# Patient Record
Sex: Female | Born: 1935 | Race: White | Hispanic: No | Marital: Single | Smoking: Never smoker
Health system: Southern US, Community
[De-identification: ages and names within clinical notes are randomized; demographics above are authoritative.]

## PROBLEM LIST (undated history)

## (undated) DIAGNOSIS — K219 Gastro-esophageal reflux disease without esophagitis: Secondary | ICD-10-CM

## (undated) DIAGNOSIS — E7800 Pure hypercholesterolemia, unspecified: Secondary | ICD-10-CM

## (undated) DIAGNOSIS — I4891 Unspecified atrial fibrillation: Secondary | ICD-10-CM

## (undated) HISTORY — PX: CORONARY ANGIOPLASTY WITH STENT PLACEMENT: SHX49

## (undated) HISTORY — DX: Gastro-esophageal reflux disease without esophagitis: K21.9

---

## 2015-12-18 ENCOUNTER — Emergency Department (HOSPITAL_BASED_OUTPATIENT_CLINIC_OR_DEPARTMENT_OTHER)
Admission: EM | Admit: 2015-12-18 | Discharge: 2015-12-18 | Disposition: A | Discharge status: Home / Self Care | Payer: Medicare Other | Attending: Emergency Medicine | Admitting: Emergency Medicine

## 2015-12-18 ENCOUNTER — Encounter (HOSPITAL_BASED_OUTPATIENT_CLINIC_OR_DEPARTMENT_OTHER): Payer: Self-pay | Admitting: Emergency Medicine

## 2015-12-18 ENCOUNTER — Emergency Department (HOSPITAL_BASED_OUTPATIENT_CLINIC_OR_DEPARTMENT_OTHER): Payer: Medicare Other

## 2015-12-18 DIAGNOSIS — Z7982 Long term (current) use of aspirin: Secondary | ICD-10-CM | POA: Insufficient documentation

## 2015-12-18 DIAGNOSIS — M1711 Unilateral primary osteoarthritis, right knee: Secondary | ICD-10-CM | POA: Insufficient documentation

## 2015-12-18 DIAGNOSIS — Z79899 Other long term (current) drug therapy: Secondary | ICD-10-CM | POA: Insufficient documentation

## 2015-12-18 DIAGNOSIS — M25562 Pain in left knee: Secondary | ICD-10-CM

## 2015-12-18 HISTORY — DX: Unspecified atrial fibrillation: I48.91

## 2015-12-18 HISTORY — DX: Pure hypercholesterolemia, unspecified: E78.00

## 2015-12-18 MED ORDER — HYDROCODONE-ACETAMINOPHEN 5-325 MG PO TABS: 1.0000 | ORAL_TABLET | Freq: Four times a day (QID) | ORAL | 0 refills | Status: AC | PRN

## 2015-12-18 MED ORDER — BUPIVACAINE HCL 0.5 % IJ SOLN
10.0000 mL | Freq: Once | INTRAMUSCULAR | Status: AC
  Administered 2015-12-18: 10 mL
  Filled 2015-12-18: qty 1

## 2015-12-18 MED ORDER — HYDROCODONE-ACETAMINOPHEN 5-325 MG PO TABS
1.0000 | ORAL_TABLET | Freq: Once | ORAL | Status: AC
  Administered 2015-12-18: 1 via ORAL
  Filled 2015-12-18: qty 1

## 2015-12-18 MED ORDER — TRIAMCINOLONE ACETONIDE 40 MG/ML IJ SUSP
40.0000 mg | Freq: Once | INTRAMUSCULAR | Status: AC
  Administered 2015-12-18: 40 mg via INTRA_ARTICULAR
  Filled 2015-12-18: qty 5

## 2015-12-18 NOTE — ED Notes (Signed)
Pt made aware to return if symptoms worsen or if any life threatening symptoms occur.   

## 2015-12-18 NOTE — ED Provider Notes (Signed)
[facility]   CSN: [id number] Arrival date & time: 12/18/15  1036     History   Chief Complaint Chief Complaint  Patient presents with  . Knee Pain    left    HPI Colleen Berger is a 82 y.o. female.  The history is provided by the patient.  Knee Pain   This is a recurrent problem. Episode onset: 1 week but much worse today. The problem occurs constantly. The problem has been gradually worsening. The pain is present in the left knee. The quality of the pain is described as aching, pounding, sharp and constant. The pain is at a severity of 9/10. The pain is severe. Associated symptoms include limited range of motion and stiffness. The symptoms are aggravated by activity and standing. She has tried OTC pain medications for the symptoms. The treatment provided no relief. There has been no history of extremity trauma.    Past Medical History:  Diagnosis Date  . A-fib (HCC)   . Hypercholesteremia     There are no active problems to display for this patient.   Past Surgical History:  Procedure Laterality Date  . CORONARY ANGIOPLASTY WITH STENT PLACEMENT      OB History    No data available       Home Medications    Prior to Admission medications   Medication Sig Start Date End Date Taking? Authorizing Provider  aspirin 81 MG chewable tablet Chew by mouth daily.   Yes Historical Provider, MD  diltiazem (CARDIZEM) 120 MG tablet Take 120 mg by mouth 4 (four) times daily.   Yes Historical Provider, MD  lisinopril (PRINIVIL,ZESTRIL) 10 MG tablet Take 10 mg by mouth daily.   Yes Historical Provider, MD  magnesium oxide (MAG-OX) 400 MG tablet Take 400 mg by mouth daily.   Yes Historical Provider, MD  simvastatin (ZOCOR) 80 MG tablet Take 80 mg by mouth daily.   Yes Historical Provider, MD  HYDROcodone-acetaminophen (NORCO/VICODIN) 5-325 MG tablet Take 1 tablet by mouth every 6 (six) hours as needed for severe pain. 12/18/15   Gwyneth Sprout, MD     Family History History reviewed. No pertinent family history.  Social History Social History  Substance Use Topics  . Smoking status: Never Smoker  . Smokeless tobacco: Never Used  . Alcohol use No     Allergies   Review of patient's allergies indicates no known allergies.   Review of Systems Review of Systems  Constitutional: Negative for fever.  Musculoskeletal: Positive for stiffness.  All other systems reviewed and are negative.    Physical Exam Updated Vital Signs BP 142/90 (BP Location: Right Arm)   Pulse 84   Temp 98.2 F (36.8 C) (Oral)   Resp 18   Ht 5\' 6"  (1.676 m)   Wt 195 lb (88.5 kg)   SpO2 100%   BMI 31.47 kg/m   Physical Exam  Constitutional: She is oriented to person, place, and time. She appears well-developed and well-nourished. No distress.  HENT:  Head: Normocephalic and atraumatic.  Eyes: EOM are normal. Pupils are equal, round, and reactive to light.  Cardiovascular: Normal rate.   Pulmonary/Chest: Effort normal.  Musculoskeletal:       Left knee: She exhibits decreased range of motion, swelling and bony tenderness. She exhibits no ecchymosis, no deformity and no erythema. Tenderness found. Medial joint line tenderness noted.  Neurological: She is alert and oriented to person, place, and time.  Skin: Skin is warm and dry. Capillary  refill takes less than 2 seconds.  Psychiatric: She has a normal mood and affect. Her behavior is normal.  Nursing note and vitals reviewed.    ED Treatments / Results  Labs (all labs ordered are listed, but only abnormal results are displayed) Labs Reviewed - No data to display  EKG  EKG Interpretation None       Radiology Dg Knee Complete 4 Views Left  Result Date: 12/18/2015 CLINICAL DATA:  82 year old female with knee pain EXAM: LEFT KNEE - COMPLETE 4+ VIEW COMPARISON:  None. FINDINGS: No joint effusion. There is no fracture or subluxation identified. Mild tricompartment osteoarthritis is  identified. IMPRESSION: 1. No acute findings. 2. Osteoarthritis. Electronically Signed   By: Signa Kellaylor  Stroud M.D.   On: 12/18/2015 11:30    Procedures .Joint Aspiration/Arthrocentesis Date/Time: 12/18/2015 12:58 PM Performed by: Gwyneth SproutPLUNKETT, Brittnee Gaetano Authorized by: Gwyneth SproutPLUNKETT, Dion Sibal   Consent:    Consent obtained:  Verbal   Consent given by:  Patient   Risks discussed:  Bleeding, infection and pain   Alternatives discussed:  Referral and observation Location:    Location:  Knee   Knee:  L knee Anesthesia (see MAR for exact dosages):    Anesthesia method:  Local infiltration   Local anesthetic:  Bupivacaine 0.5% w/o epi Procedure details:    Preparation: Patient was prepped and draped in usual sterile fashion     Needle gauge: 21.   Ultrasound guidance: no     Approach:  Medial   Aspirate amount:  Flash of bloody fluid   Aspirate characteristics:  Blood-tinged   Steroid injected: yes     Specimen collected: no   Post-procedure details:    Dressing:  Adhesive bandage   Patient tolerance of procedure:  Tolerated well, no immediate complications   (including critical care time)  Medications Ordered in ED Medications  bupivacaine (MARCAINE) 0.5 % (with pres) injection 10 mL (10 mLs Infiltration Given by Other 12/18/15 1124)  triamcinolone acetonide (KENALOG-40) injection 40 mg (40 mg Intra-articular Given by Other 12/18/15 1123)  HYDROcodone-acetaminophen (NORCO/VICODIN) 5-325 MG per tablet 1 tablet (1 tablet Oral Given 12/18/15 1216)     Initial Impression / Assessment and Plan / ED Course  I have reviewed the triage vital signs and the nursing notes.  Pertinent labs & imaging results that were available during my care of the patient were reviewed by me and considered in my medical decision making (see chart for details).  Clinical Course   Patient with known arthritis of her bilateral knees presenting with 1 week of worsening pain in her left knee today. Patient has had  injections in the past but is requesting injection in her knee today for pain. 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 No evidence of septic joint or gout. There is no erythema but mild swelling. X-rays consistent with osteoarthritis. Patient received an intra-articular injection of Kenalog and bupivacaine. She had significant improvement in her pain and she was given hydrocodone to use when necessary. Also instructed to follow-up with Dr. Kristen LoaderFurr  Final Clinical Impressions(s) / ED Diagnoses   Final diagnoses:  Knee pain, acute, left  Primary osteoarthritis of right knee    New Prescriptions Discharge Medication List as of 12/18/2015 12:08 PM    START taking these medications   Details  HYDROcodone-acetaminophen (NORCO/VICODIN) 5-325 MG tablet Take 1 tablet by mouth every 6 (six) hours as needed for severe pain., Starting Sun 12/18/2015, Print  Gwyneth Sprout, MD 12/18/15 1300

## 2015-12-18 NOTE — ED Triage Notes (Signed)
Patient states that she has had an ache to her left knee x 1 week

## 2016-08-11 ENCOUNTER — Emergency Department (HOSPITAL_BASED_OUTPATIENT_CLINIC_OR_DEPARTMENT_OTHER)
Admission: EM | Admit: 2016-08-11 | Discharge: 2016-08-12 | Disposition: A | Discharge status: Home / Self Care | Payer: Medicare Other | Attending: Emergency Medicine | Admitting: Emergency Medicine

## 2016-08-11 ENCOUNTER — Encounter (HOSPITAL_BASED_OUTPATIENT_CLINIC_OR_DEPARTMENT_OTHER): Payer: Self-pay | Admitting: Emergency Medicine

## 2016-08-11 DIAGNOSIS — Z7982 Long term (current) use of aspirin: Secondary | ICD-10-CM | POA: Insufficient documentation

## 2016-08-11 DIAGNOSIS — L509 Urticaria, unspecified: Secondary | ICD-10-CM

## 2016-08-11 DIAGNOSIS — Z79899 Other long term (current) drug therapy: Secondary | ICD-10-CM | POA: Insufficient documentation

## 2016-08-11 DIAGNOSIS — R21 Rash and other nonspecific skin eruption: Secondary | ICD-10-CM | POA: Diagnosis present

## 2016-08-11 MED ORDER — DEXAMETHASONE SODIUM PHOSPHATE 10 MG/ML IJ SOLN
10.0000 mg | Freq: Once | INTRAMUSCULAR | Status: AC
  Administered 2016-08-12: 10 mg via INTRAMUSCULAR
  Filled 2016-08-11: qty 1

## 2016-08-11 NOTE — ED Triage Notes (Addendum)
PT presents to ED with complaints of redness and itching to palms and forearms. Pt took 4 benadryl 25 mg  Today. Pt has history of sam and states she normally gets a prednisone shot when this happens.

## 2016-08-11 NOTE — ED Notes (Signed)
ED Provider at bedside. 

## 2016-08-11 NOTE — ED Provider Notes (Signed)
[facility]   CSN: [id number] Arrival date & time: 08/11/16  2032  By signing my name below, I, Doreatha Martin and Deland Pretty, attest that this documentation has been prepared under the direction and in the presence of Fayrene Helper, PA-C. Electronically Signed: Doreatha Martin and Deland Pretty, ED Scribe. 08/12/16. 12:05 AM.   History   Chief Complaint Chief Complaint  Patient presents with  . Itching    HPI Colleen Berger is a 83 y.o. female who presents to the Emergency Department complaining of a gradually spreading pruritic rash to the palms, bilateral forearms and the belt line that began at noon. Per pt, her rash started in palms of her hands and began spreading to the forearms and belt line of the abdomen. Pt states she has taken 100 mg Benadryl with no relief of her symptoms. No new soaps, lotions, detergents, foods, animals, plants, medications. Pt reports h/o similar rashes and itching, but has not been able to ascertain the cause. She does reports they are becoming more frequent. Per pt, her current symptoms are slightly more severe than normal. She has never been evaluated for this issue by a dermatologist, hematologist or allergist. She denies rash in the mouth, tongue or lip swelling, difficulty breathing, CP, SOB, abdominal pain, diarrhea, nausea, vomiting.   The history is provided by the patient and a relative. No language interpreter was used.    Past Medical History:  Diagnosis Date  . A-fib (HCC)   . Hypercholesteremia     There are no active problems to display for this patient.   Past Surgical History:  Procedure Laterality Date  . CORONARY ANGIOPLASTY WITH STENT PLACEMENT      OB History    No data available       Home Medications    Prior to Admission medications   Medication Sig Start Date End Date Taking? Authorizing Provider  aspirin 81 MG chewable tablet Chew by mouth daily.    Historical Provider, MD  diltiazem  (CARDIZEM) 120 MG tablet Take 120 mg by mouth 4 (four) times daily.    Historical Provider, MD  HYDROcodone-acetaminophen (NORCO/VICODIN) 5-325 MG tablet Take 1 tablet by mouth every 6 (six) hours as needed for severe pain. 12/18/15   Gwyneth Sprout, MD  lisinopril (PRINIVIL,ZESTRIL) 10 MG tablet Take 10 mg by mouth daily.    Historical Provider, MD  magnesium oxide (MAG-OX) 400 MG tablet Take 400 mg by mouth daily.    Historical Provider, MD  simvastatin (ZOCOR) 80 MG tablet Take 80 mg by mouth daily.    Historical Provider, MD    Family History No family history on file.  Social History Social History  Substance Use Topics  . Smoking status: Never Smoker  . Smokeless tobacco: Never Used  . Alcohol use No     Allergies   Patient has no known allergies.   Review of Systems Review of Systems  HENT: Negative for facial swelling and trouble swallowing.   Respiratory: Negative for shortness of breath.   Cardiovascular: Negative for chest pain.  Gastrointestinal: Negative for abdominal pain, diarrhea, nausea and vomiting.  Skin: Positive for rash.     Physical Exam Updated Vital Signs BP 125/86 (BP Location: Left Arm)   Pulse 92   Temp 98 F (36.7 C) (Oral)   Resp 19   Ht 5' 5.5" (1.664 m)   Wt 200 lb (90.7 kg)   SpO2 100%   BMI 32.78 kg/m   Physical Exam  Constitutional: She appears well-developed and well-nourished.  HENT:  Head: Normocephalic.  Mouth/Throat: Uvula is midline, oropharynx is clear and moist and mucous membranes are normal. No oropharyngeal exudate, posterior oropharyngeal edema or posterior oropharyngeal erythema.  No tongue swelling, airway patent. Throat normal.   Eyes: Conjunctivae are normal.  Cardiovascular: Normal rate, regular rhythm and normal heart sounds.  Exam reveals no gallop and no friction rub.   No murmur heard. Pulmonary/Chest: Effort normal and breath sounds normal. No respiratory distress. She has no wheezes.  Abdominal: She  exhibits no distension.  Musculoskeletal: Normal range of motion.  Neurological: She is alert.  Skin: Skin is warm and dry. Rash noted.  Fine maculopapular rash noted throughout bilateral forearms and belt line of abdomen.   Psychiatric: She has a normal mood and affect. Her behavior is normal.  Nursing note and vitals reviewed.    ED Treatments / Results   DIAGNOSTIC STUDIES: Oxygen Saturation is 100% on RA, normal by my interpretation.   COORDINATION OF CARE: 11:39 PM-Discussed next steps with pt which includes IM decadron. Pt verbalized understanding and is agreeable with the plan.   Labs (all labs ordered are listed, but only abnormal results are displayed) Labs Reviewed - No data to display  EKG  EKG Interpretation None       Radiology No results found.  Procedures Procedures (including critical care time)  Medications Ordered in ED Medications  dexamethasone (DECADRON) injection 10 mg (not administered)     Initial Impression / Assessment and Plan / ED Course  I have reviewed the triage vital signs and the nursing notes.  Pertinent labs & imaging results that were available during my care of the patient were reviewed by me and considered in my medical decision making (see chart for details).   Allyn Kenner presents to the ED for evaluation of itchy rash, suspect allergic rash to unknown source.  Hx of same multiple times in the past.  No evidence concerning for anaphylaxis. Patient will be sent home with a short course of steroid, benadryl, pepcid. Conservative therapies discussed and recommended. Patient advised to follow up with allergist. Patient appears stable for discharge at this time. Return precautions discussed and outlined in discharge paperwork. Patient is agreeable to plan. Care discussed with Dr. Read Drivers    Final Clinical Impressions(s) / ED Diagnoses   Final diagnoses:  Urticarial rash    New Prescriptions New Prescriptions    FAMOTIDINE (PEPCID) 20 MG TABLET    Take 1 tablet (20 mg total) by mouth 2 (two) times daily.   PREDNISONE (DELTASONE) 20 MG TABLET    3 tabs po day one, then 2 tabs daily x 4 days   I personally performed the services described in this documentation, which was scribed in my presence. The recorded information has been reviewed and is accurate.        Fayrene Helper, PA-C 08/12/16 0026    Paula Libra, MD 08/12/16 [phone]

## 2016-08-12 DIAGNOSIS — L509 Urticaria, unspecified: Secondary | ICD-10-CM | POA: Diagnosis not present

## 2016-08-12 MED ORDER — PREDNISONE 20 MG PO TABS: ORAL_TABLET | ORAL | 0 refills | Status: AC

## 2016-08-12 MED ORDER — HYDROXYZINE HCL 25 MG PO TABS
25.0000 mg | ORAL_TABLET | Freq: Once | ORAL | Status: AC
  Administered 2016-08-12: 25 mg via ORAL
  Filled 2016-08-12: qty 1

## 2016-08-12 MED ORDER — FAMOTIDINE 20 MG PO TABS: 20.0000 mg | ORAL_TABLET | Freq: Two times a day (BID) | ORAL | 0 refills | Status: AC

## 2016-08-12 MED ORDER — FAMOTIDINE 20 MG PO TABS
20.0000 mg | ORAL_TABLET | Freq: Once | ORAL | Status: AC
  Administered 2016-08-12: 20 mg via ORAL
  Filled 2016-08-12: qty 1

## 2016-08-12 NOTE — Discharge Instructions (Signed)
Your skin rash is likely due to an allergic reaction of an unknown source.  Please follow up with your doctor or with allergist for further evaluation of the cause.  Take prednisone, benadryl and pepcid for symptoms control.  Return if you develop lightheadedness, tongue swelling, trouble breathing or if you have other concerns.

## 2017-08-05 ENCOUNTER — Telehealth: Payer: Self-pay | Admitting: Medical

## 2017-08-05 ENCOUNTER — Ambulatory Visit (HOSPITAL_BASED_OUTPATIENT_CLINIC_OR_DEPARTMENT_OTHER)
Admission: RE | Admit: 2017-08-05 | Discharge: 2017-08-05 | Disposition: A | Discharge status: Home / Self Care | Payer: Medicare Other | Source: Ambulatory Visit | Attending: Medical | Admitting: Medical

## 2017-08-05 ENCOUNTER — Ambulatory Visit: Payer: Medicare Other | Admitting: Medical

## 2017-08-05 ENCOUNTER — Encounter: Payer: Self-pay | Admitting: Medical

## 2017-08-05 ENCOUNTER — Ambulatory Visit (INDEPENDENT_AMBULATORY_CARE_PROVIDER_SITE_OTHER): Payer: Medicare Other | Admitting: Medical

## 2017-08-05 VITALS — BP 155/90 | HR 96 | Temp 97.9°F | Resp 16 | Ht 65.0 in | Wt 218.6 lb

## 2017-08-05 DIAGNOSIS — R195 Other fecal abnormalities: Secondary | ICD-10-CM | POA: Diagnosis not present

## 2017-08-05 DIAGNOSIS — M546 Pain in thoracic spine: Secondary | ICD-10-CM

## 2017-08-05 DIAGNOSIS — J449 Chronic obstructive pulmonary disease, unspecified: Secondary | ICD-10-CM | POA: Insufficient documentation

## 2017-08-05 DIAGNOSIS — R0600 Dyspnea, unspecified: Secondary | ICD-10-CM | POA: Diagnosis not present

## 2017-08-05 DIAGNOSIS — Z8679 Personal history of other diseases of the circulatory system: Secondary | ICD-10-CM

## 2017-08-05 DIAGNOSIS — I517 Cardiomegaly: Secondary | ICD-10-CM | POA: Insufficient documentation

## 2017-08-05 DIAGNOSIS — R600 Localized edema: Secondary | ICD-10-CM

## 2017-08-05 LAB — COMPREHENSIVE METABOLIC PANEL
ALT: 14 U/L (ref 0–35)
AST: 18 U/L (ref 0–37)
Albumin: 3.9 g/dL (ref 3.5–5.2)
Alkaline Phosphatase: 65 U/L (ref 39–117)
BUN: 17 mg/dL (ref 6–23)
CO2: 27 meq/L (ref 19–32)
CREATININE: 0.76 mg/dL (ref 0.40–1.20)
Calcium: 9.4 mg/dL (ref 8.4–10.5)
Chloride: 106 mEq/L (ref 96–112)
GFR: 77.03 mL/min (ref >60.00)
Glucose, Bld: 88 mg/dL (ref 70–99)
Potassium: 3.8 mEq/L (ref 3.5–5.1)
SODIUM: 142 meq/L (ref 135–145)
Total Bilirubin: 0.5 mg/dL (ref 0.2–1.2)
Total Protein: 7 g/dL (ref 6.0–8.3)

## 2017-08-05 LAB — TROPONIN I: TNIDX: 0 ug/l (ref 0.00–0.06)

## 2017-08-05 LAB — BRAIN NATRIURETIC PEPTIDE: Pro B Natriuretic peptide (BNP): 61 pg/mL (ref 0.0–100.0)

## 2017-08-05 MED ORDER — TRAMADOL HCL 50 MG PO TABS: 50.0000 mg | ORAL_TABLET | Freq: Four times a day (QID) | ORAL | 0 refills | Status: AC | PRN

## 2017-08-05 MED ORDER — HYDROCHLOROTHIAZIDE 12.5 MG PO CAPS: 12.5000 mg | ORAL_CAPSULE | Freq: Every day | ORAL | 3 refills | Status: AC

## 2017-08-05 NOTE — Patient Instructions (Addendum)
For your history of pedal edema recently, mild dyspnea and history of atrial fibrillation, I do want to get CMP, BNP and chest x-ray today.  With these labs will be able to determine if you have any indicators of CHF.  For your history of chronic thoracic back pain, we will get x-rays and see if you have any scoliosis, kyphosis or compression fracture.  Presently will prescribe Toradol limited number for pain.  Might consider low-dose taper course of prednisone in the near future after lab review.  For chronic intermittent episodes of transient loose stools, I want you to use Imodium over-the-counter.  You typically describe these events as only lasting for couple hours then to stools become formed/resolve. If you loose stool exceeding one day notify me and would need to do stool panel studies.   In addition to getting above labs deciding on getting troponin heart protein study. Current symptoms don't appear cardiac like presently but since you are new with history of stents do want to get troponin.  After labs back likely add medication for bp.  Follow up in 2-3 weeks or as needed

## 2017-08-05 NOTE — Telephone Encounter (Signed)
Sent in additional medication for bp. Low dose diuretic.

## 2017-08-05 NOTE — Progress Notes (Signed)
Subjective:    Patient ID: Colleen KennerBonnie Akopyan, female    DOB: 1932/02/16, 84 y.o.   MRN: [id number]  HPI  Pt was seen local MD with [facility] in the past.  Pt in for some loose stools intermittently. She states will occur very sporadically. She stats will get loose stools consecutively in a day then loose stools will resolve in less than a day. Today is example of loose stools. THis can happen one day in a month then resolve.   Pt does also have swelling in her legs. She has hx of atrial fibrillation. Pt is on diltiazem and is on eliquis. Some swelling of her legs recently. Pt weight up from one year ago. But no recent weight in epic.  She does report some daily intermittent episodes of mild shortness of breath.  But she is not reporting any chest pain or other type cardiac associated signs or symptoms.   Hx of small stroke in past. Pt states one stroke. Daughter states 3 strokes.  Some thoracic area pain on and off for 30 years. Pain recenlty worse. Sometimes over past year will last for a week. Then go away.    Review of Systems  Constitutional: Negative for chills, fatigue and fever.  HENT: Negative for congestion, facial swelling and nosebleeds.   Respiratory: Negative for chest tightness, shortness of breath and wheezing.   Cardiovascular: Negative for chest pain and palpitations.  Gastrointestinal: Positive for diarrhea. Negative for abdominal distention, abdominal pain, blood in stool, constipation, nausea and vomiting.       Loose stools for one day recently.  Genitourinary: Negative for decreased urine volume, difficulty urinating, dysuria, flank pain, frequency, pelvic pain, urgency and vaginal pain.  Musculoskeletal: Positive for back pain. Negative for arthralgias, myalgias and neck stiffness.  Hematological: Negative for adenopathy. Does not bruise/bleed easily.  Psychiatric/Behavioral: Negative for behavioral problems.   Past Medical History:  Diagnosis Date  . A-fib (HCC)    . Hypercholesteremia      Social History   Socioeconomic History  . Marital status: Single    Spouse name: Not on file  . Number of children: Not on file  . Years of education: Not on file  . Highest education level: Not on file  Occupational History  . Not on file  Social Needs  . Financial resource strain: Not on file  . Food insecurity:    Worry: Not on file    Inability: Not on file  . Transportation needs:    Medical: Not on file    Non-medical: Not on file  Tobacco Use  . Smoking status: Never Smoker  . Smokeless tobacco: Never Used  Substance and Sexual Activity  . Alcohol use: No  . Drug use: No  . Sexual activity: Not on file  Lifestyle  . Physical activity:    Days per week: Not on file    Minutes per session: Not on file  . Stress: Not on file  Relationships  . Social connections:    Talks on phone: Not on file    Gets together: Not on file    Attends religious service: Not on file    Active member of club or organization: Not on file    Attends meetings of clubs or organizations: Not on file    Relationship status: Not on file  . Intimate partner violence:    Fear of current or ex partner: Not on file    Emotionally abused: Not on file    Physically  abused: Not on file    Forced sexual activity: Not on file  Other Topics Concern  . Not on file  Social History Narrative  . Not on file    Past Surgical History:  Procedure Laterality Date  . CORONARY ANGIOPLASTY WITH STENT PLACEMENT      No family history on file.  No Known Allergies  Current Outpatient Medications on File Prior to Visit  Medication Sig Dispense Refill  . aspirin 81 MG chewable tablet Chew by mouth daily.    Marland Kitchen diltiazem (CARDIZEM) 120 MG tablet Take 120 mg by mouth 4 (four) times daily.    Marland Kitchen ELIQUIS 5 MG TABS tablet Take 5 mg by mouth.  4  . famotidine (PEPCID) 20 MG tablet Take 1 tablet (20 mg total) by mouth 2 (two) times daily. 30 tablet 0  . HYDROcodone-acetaminophen  (NORCO/VICODIN) 5-325 MG tablet Take 1 tablet by mouth every 6 (six) hours as needed for severe pain. 10 tablet 0  . lisinopril (PRINIVIL,ZESTRIL) 10 MG tablet Take 10 mg by mouth daily.    . magnesium oxide (MAG-OX) 400 MG tablet Take 400 mg by mouth daily.    . predniSONE (DELTASONE) 20 MG tablet 3 tabs po day one, then 2 tabs daily x 4 days 11 tablet 0  . simvastatin (ZOCOR) 80 MG tablet Take 80 mg by mouth daily.     No current facility-administered medications on file prior to visit.     BP (!) 178/81   Pulse 80   Temp 97.9 F (36.6 C) (Oral)   Resp 16   Wt 218 lb 9.6 oz (99.2 kg)   SpO2 97%   BMI 35.82 kg/m       Objective:   Physical Exam  General Appearance- Not in acute distress.  HEENT Eyes- Scleraeral/Conjuntiva-bilat- Not Yellow. Mouth & Throat- Normal.  Chest and Lung Exam Auscultation: Breath sounds:-Normal. Adventitious sounds:- No Adventitious sounds.  Cardiovascular Auscultation:Rythm - Regular. Heart Sounds -Normal heart sounds.  Abdomen Inspection:-Inspection Normal.  Palpation/Perucssion: Palpation and Percussion of the abdomen reveal- Non Tender, No Rebound tenderness, No rigidity(Guarding) and No Palpable abdominal masses.  Liver:-Normal.  Spleen:- Normal.    Back- mid lower thoracic. Rt side thoracic area.  Lower ext-calves are symmetric but large.  1+ pedal edema at best.  Negative Homans sign bilaterally.     Assessment & Plan:  For your history of pedal edema recently, mild dyspnea and history of atrial fibrillation, I do want to get CMP, BNP and chest x-ray today.  With these labs will be able to determine if you have any indicators of CHF.  For your history of chronic thoracic back pain, we will get x-rays and see if you have any scoliosis, kyphosis or compression fracture.  Presently will prescribe Toradol limited number for pain.  Might consider low-dose taper course of prednisone in the near future after lab review.  For chronic  intermittent episodes of transient loose stools, I want you to use Imodium over-the-counter.  You typically describe these events as only lasting for couple hours then to stools become formed/resolve. If you loose stool exceeding one day notify me and would need to do stool panel studies.   In addition to getting above labs deciding on getting troponin heart protein study. Current symptoms don't appear cardiac like presently but since you are new with history of stents do want to get troponin.  After labs back likely add medication for bp.  Follow up in 2-3 weeks or as needed  Mackie Pai, PA-C

## 2017-08-05 NOTE — Telephone Encounter (Signed)
Need pt weight documented in the chart. Her sheet is in shred ben likely. Please remember to put vitals in. Can you look for this value and let me know.

## 2017-08-06 NOTE — Telephone Encounter (Signed)
Pt weight is in chart.

## 2018-12-17 IMAGING — DX DG THORACIC SPINE 2V
3 series · 3 of 3 positions shown · non-contrast
Comparison: 08/05/2017

CLINICAL DATA: Right-sided chest pain

EXAM:
THORACIC SPINE 2 VIEWS

[t-spine ap]
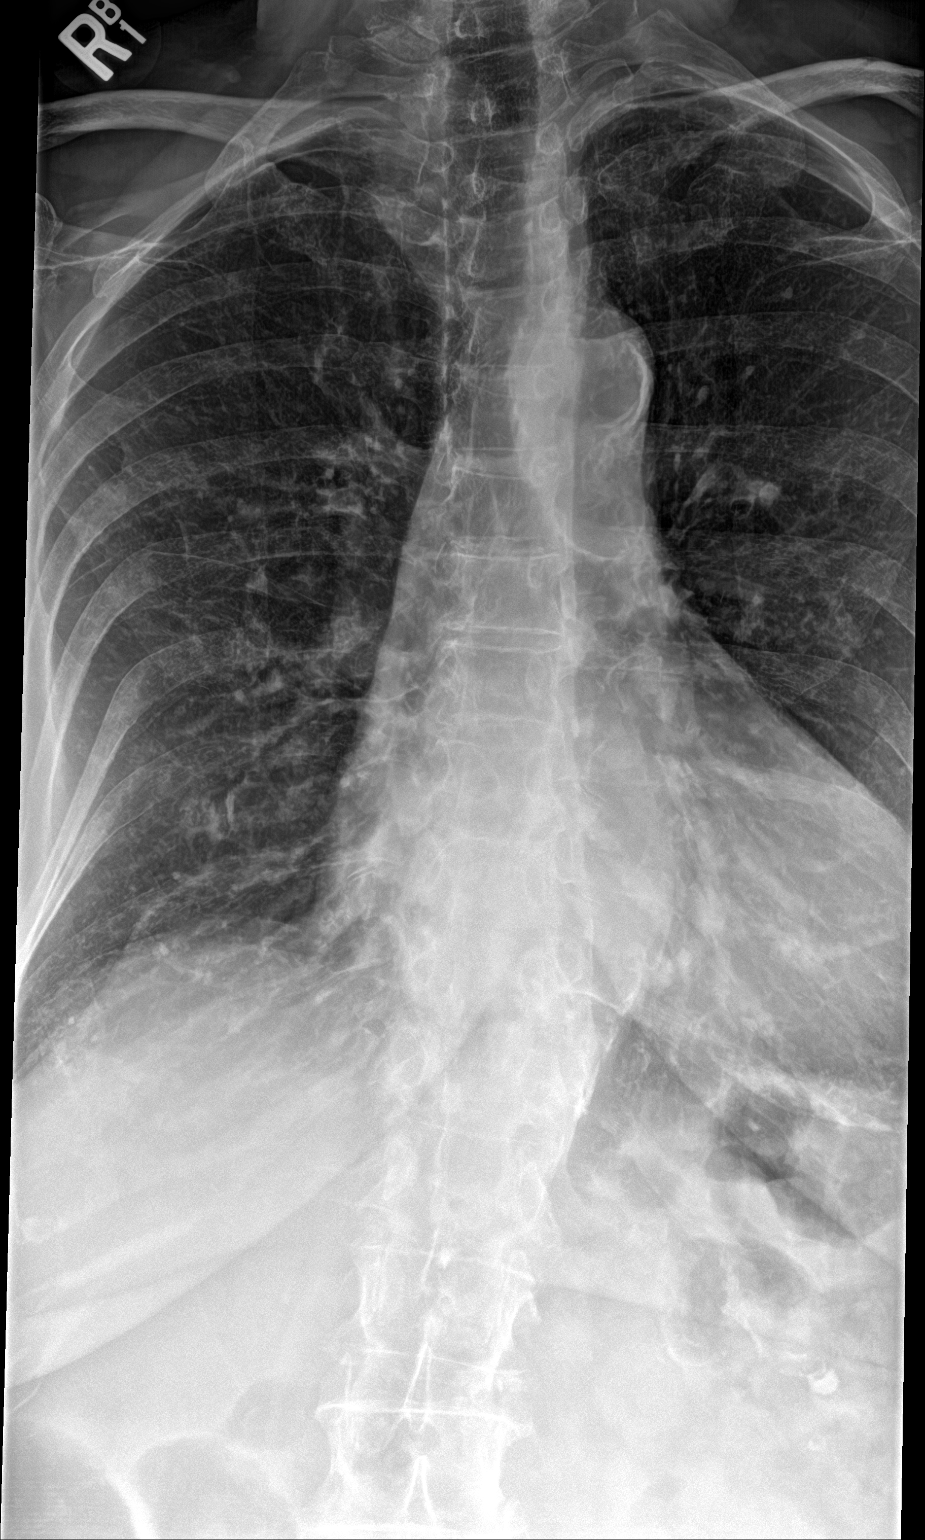

[t-spine lat]
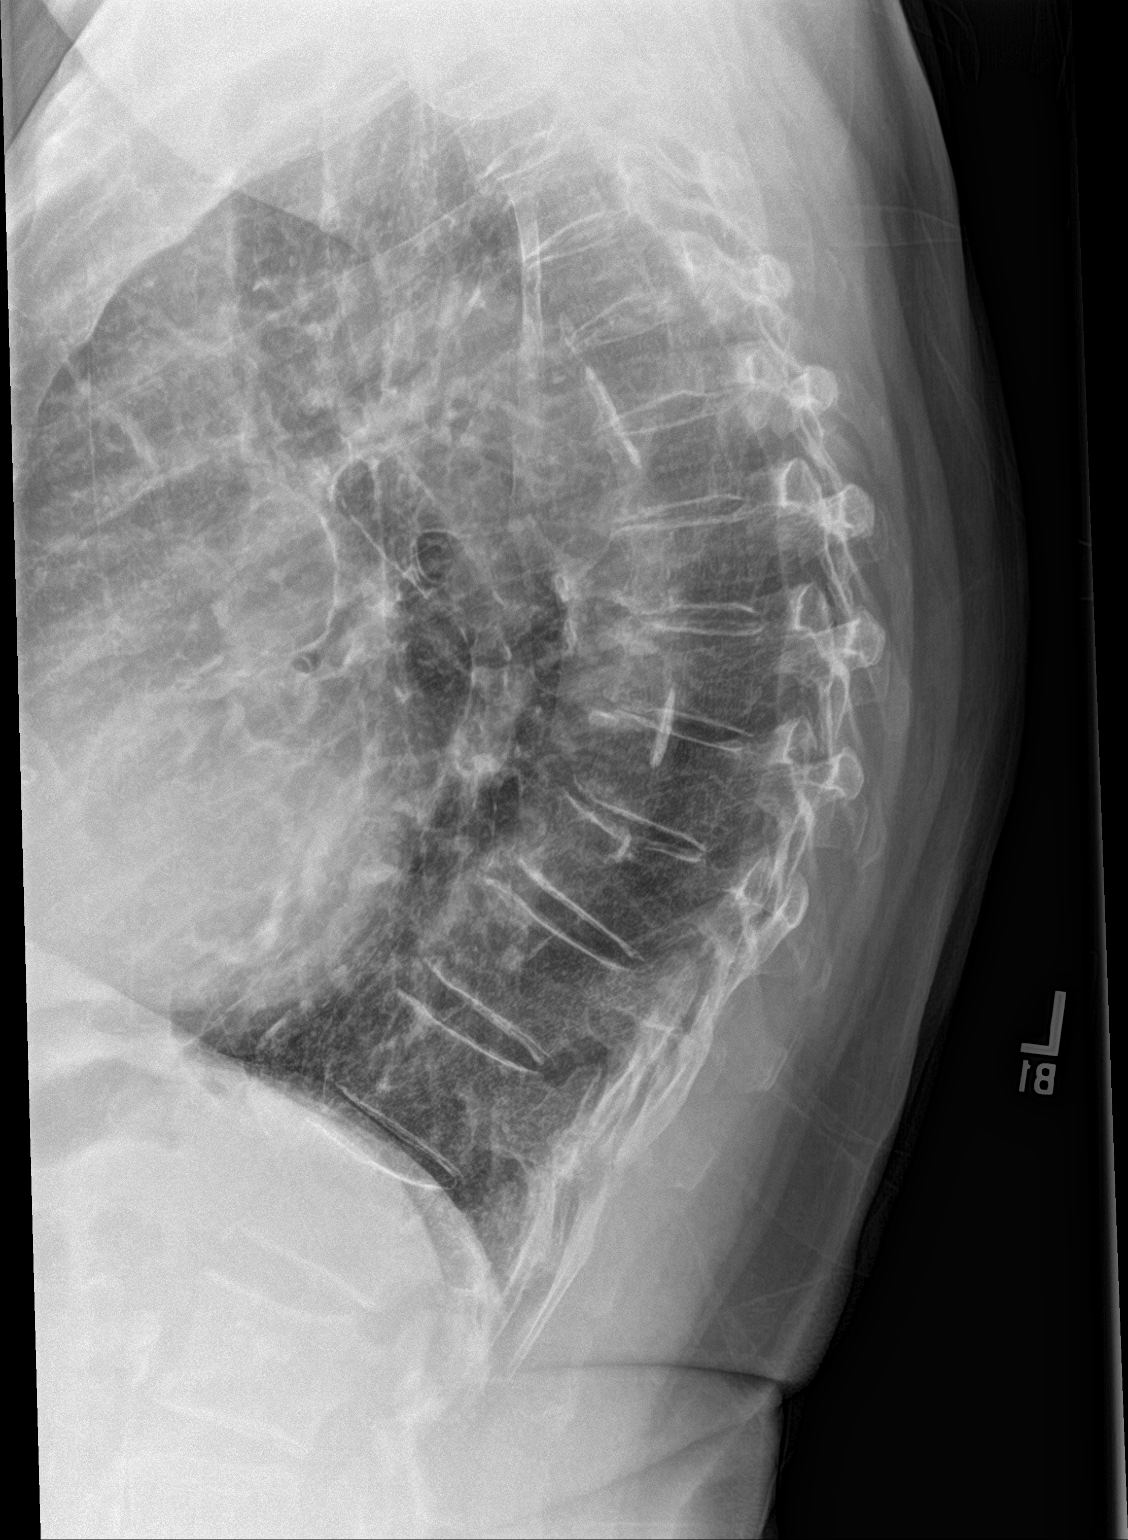

[t-spine swimmers]
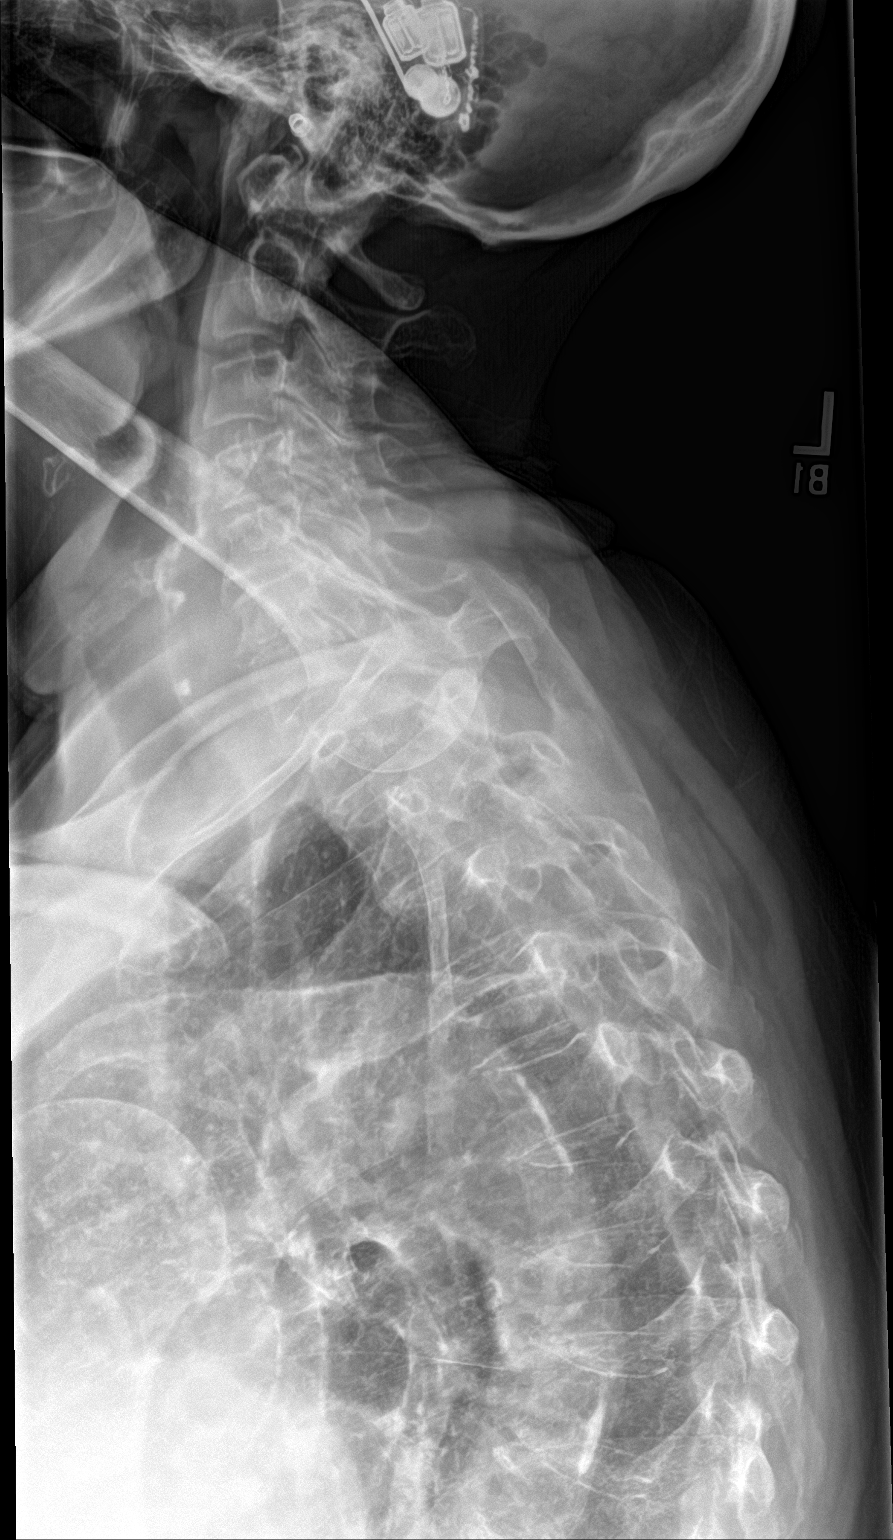

[3 of 3 positions shown; findings below may reference images not displayed]

FINDINGS: Very mild chronic appearing compression deformity is noted at T9. No
other compression deformity is noted. No paraspinal mass is seen. No
definitive rib abnormality is seen.
IMPRESSION: No acute abnormality noted. Chronic appearing T9 compression
deformity.
